# Patient Record
Sex: Male | Born: 1975 | Race: Black or African American | Hispanic: No | Marital: Married | Smoking: Former smoker
Health system: Southern US, Community
[De-identification: ages and names within clinical notes are randomized; demographics above are authoritative.]

## PROBLEM LIST (undated history)

## (undated) DIAGNOSIS — I10 Essential (primary) hypertension: Secondary | ICD-10-CM

---

## 2020-03-19 ENCOUNTER — Emergency Department (HOSPITAL_COMMUNITY): Payer: Self-pay

## 2020-03-19 ENCOUNTER — Other Ambulatory Visit: Payer: Self-pay

## 2020-03-19 ENCOUNTER — Emergency Department (HOSPITAL_COMMUNITY)
Admission: EM | Admit: 2020-03-19 | Discharge: 2020-03-19 | Disposition: A | Discharge status: Home / Self Care | Payer: Self-pay | Attending: Emergency Medicine | Admitting: Emergency Medicine

## 2020-03-19 ENCOUNTER — Encounter (HOSPITAL_COMMUNITY): Payer: Self-pay | Admitting: Emergency Medicine

## 2020-03-19 DIAGNOSIS — I10 Essential (primary) hypertension: Secondary | ICD-10-CM | POA: Insufficient documentation

## 2020-03-19 DIAGNOSIS — Z20822 Contact with and (suspected) exposure to covid-19: Secondary | ICD-10-CM | POA: Insufficient documentation

## 2020-03-19 DIAGNOSIS — R42 Dizziness and giddiness: Secondary | ICD-10-CM | POA: Insufficient documentation

## 2020-03-19 DIAGNOSIS — I509 Heart failure, unspecified: Secondary | ICD-10-CM

## 2020-03-19 DIAGNOSIS — I517 Cardiomegaly: Secondary | ICD-10-CM | POA: Insufficient documentation

## 2020-03-19 DIAGNOSIS — Z87891 Personal history of nicotine dependence: Secondary | ICD-10-CM | POA: Insufficient documentation

## 2020-03-19 HISTORY — DX: Essential (primary) hypertension: I10

## 2020-03-19 LAB — TROPONIN I (HIGH SENSITIVITY)
Troponin I (High Sensitivity): 30 ng/L — ABNORMAL HIGH (ref <18)
Troponin I (High Sensitivity): 29 ng/L — ABNORMAL HIGH (ref <18)

## 2020-03-19 LAB — BASIC METABOLIC PANEL
Anion gap: 8 (ref 5–15)
BUN: 14 mg/dL (ref 6–20)
CO2: 21 mmol/L — ABNORMAL LOW (ref 22–32)
Calcium: 9 mg/dL (ref 8.9–10.3)
Chloride: 109 mmol/L (ref 98–111)
Creatinine, Ser: 1.34 mg/dL — ABNORMAL HIGH (ref 0.61–1.24)
GFR, Estimated: >60 mL/min (ref >60)
Glucose, Bld: 114 mg/dL — ABNORMAL HIGH (ref 70–99)
Potassium: 3.8 mmol/L (ref 3.5–5.1)
Sodium: 138 mmol/L (ref 135–145)

## 2020-03-19 LAB — URINALYSIS, ROUTINE W REFLEX MICROSCOPIC
Bilirubin Urine: NEGATIVE
Glucose, UA: NEGATIVE mg/dL
Hgb urine dipstick: NEGATIVE
Ketones, ur: NEGATIVE mg/dL
Leukocytes,Ua: NEGATIVE
Nitrite: NEGATIVE
Protein, ur: NEGATIVE mg/dL
Specific Gravity, Urine: 1.014 (ref 1.005–1.030)
pH: 5 (ref 5.0–8.0)

## 2020-03-19 LAB — CBC
HCT: 40.6 % (ref 39.0–52.0)
Hemoglobin: 12.6 g/dL — ABNORMAL LOW (ref 13.0–17.0)
MCH: 27.9 pg (ref 26.0–34.0)
MCHC: 31 g/dL (ref 30.0–36.0)
MCV: 90 fL (ref 80.0–100.0)
Platelets: 379 10*3/uL (ref 150–400)
RBC: 4.51 MIL/uL (ref 4.22–5.81)
RDW: 13.6 % (ref 11.5–15.5)
WBC: 5.4 10*3/uL (ref 4.0–10.5)
nRBC: 0 % (ref 0.0–0.2)

## 2020-03-19 LAB — RESPIRATORY PANEL BY RT PCR (FLU A&B, COVID)
Influenza A by PCR: NEGATIVE
Influenza B by PCR: NEGATIVE
SARS Coronavirus 2 by RT PCR: NEGATIVE

## 2020-03-19 LAB — BRAIN NATRIURETIC PEPTIDE: B Natriuretic Peptide: 216.8 pg/mL — ABNORMAL HIGH (ref 0.0–100.0)

## 2020-03-19 LAB — CBG MONITORING, ED: Glucose-Capillary: 99 mg/dL (ref 70–99)

## 2020-03-19 MED ORDER — SPIRONOLACTONE 50 MG PO TABS
50.0000 mg | ORAL_TABLET | Freq: Every day | ORAL | 1 refills | Status: DC
Start: 2020-03-19 — End: 2020-12-10

## 2020-03-19 MED ORDER — IOHEXOL 350 MG/ML SOLN
80.0000 mL | Freq: Once | INTRAVENOUS | Status: AC | PRN
  Administered 2020-03-19: 80 mL via INTRAVENOUS

## 2020-03-19 MED ORDER — FUROSEMIDE 40 MG PO TABS: 40.0000 mg | ORAL_TABLET | Freq: Every day | ORAL | 0 refills | Status: DC

## 2020-03-19 MED ORDER — FUROSEMIDE 10 MG/ML IJ SOLN
40.0000 mg | Freq: Once | INTRAMUSCULAR | Status: AC
  Administered 2020-03-19: 40 mg via INTRAVENOUS
  Filled 2020-03-19: qty 4

## 2020-03-19 MED ORDER — LOSARTAN POTASSIUM 50 MG PO TABS
100.0000 mg | ORAL_TABLET | Freq: Every day | ORAL | Status: DC
  Administered 2020-03-19: 100 mg via ORAL
  Filled 2020-03-19: qty 2

## 2020-03-19 MED ORDER — CARVEDILOL 3.125 MG PO TABS
25.0000 mg | ORAL_TABLET | Freq: Two times a day (BID) | ORAL | Status: DC
  Administered 2020-03-19 (×2): 25 mg via ORAL
  Filled 2020-03-19 (×2): qty 8

## 2020-03-19 MED ORDER — ASPIRIN 81 MG PO CHEW
324.0000 mg | CHEWABLE_TABLET | Freq: Once | ORAL | Status: AC
  Administered 2020-03-19: 324 mg via ORAL
  Filled 2020-03-19: qty 4

## 2020-03-19 MED ORDER — SPIRONOLACTONE 25 MG PO TABS
50.0000 mg | ORAL_TABLET | Freq: Every day | ORAL | Status: DC
  Administered 2020-03-19: 50 mg via ORAL
  Filled 2020-03-19: qty 2

## 2020-03-19 MED ORDER — HYDRALAZINE HCL 25 MG PO TABS
100.0000 mg | ORAL_TABLET | Freq: Once | ORAL | Status: AC
  Administered 2020-03-19: 100 mg via ORAL
  Filled 2020-03-19: qty 4

## 2020-03-19 NOTE — Discharge Instructions (Addendum)
We will have you restart your losartan blood pressure medication.  I have also ordered Lasix.  Follow-up with Dr. Rosemary Holms in his office as we discussed.  Return as needed for worsening symptoms

## 2020-03-19 NOTE — ED Notes (Signed)
Pt denies any complaints at this time

## 2020-03-19 NOTE — ED Triage Notes (Signed)
Pt. Stated, I started having SOB and dizziness a couple of days ago.

## 2020-03-19 NOTE — ED Provider Notes (Signed)
[facility] EMERGENCY DEPARTMENT Provider Note   CSN: [id number] Arrival date & time: 03/19/20  2585     History Chief Complaint  Patient presents with  . Shortness of Breath  . Dizziness    William Mcgrath is a 44 y.o. male.  HPI   This patient is a 44 year old male reporting a history of hypertension and congestive heart failure, he has recently stopped smoking and denies drinking alcohol or using cocaine.  He states that recently has become more short of breath over the last several days and activities such as laying flat, trying to sleep at night or even walking up the stairs make him extremely dyspneic.  This culminated this morning during sexual intercourse with his wife where he felt like he could not finish because he was too short of breath.  The patient denies chest pain, fever, swelling of the legs but does state that he is having increasing shortness of breath and is having some coughing which is productive of some clear phlegm.  He is not vaccinated for Covid.  He reports that his symptoms have been persistent, worsening, have now become severe.  He was admitted to a hospital in [place] in [place] within the last year, told that he had congestive heart failure, has been placed on medications which she has been out of for over 3 weeks.  He denies being a diabetic and denies prior heart attack.  Past Medical History:  Diagnosis Date  . Hypertension     There are no problems to display for this patient.   History reviewed. No pertinent surgical history.     No family history on file.  Social History   Tobacco Use  . Smoking status: Former Games developer  . Smokeless tobacco: Never Used  Substance Use Topics  . Alcohol use: Not Currently  . Drug use: Not Currently    Home Medications Prior to Admission medications   Not on File    Allergies    Patient has no allergy information on record.  Review of Systems   Review of Systems  All  other systems reviewed and are negative.   Physical Exam Updated Vital Signs BP (!) 165/115 (BP Location: Right Arm)   Pulse 94   Temp 98.1 F (36.7 C) (Oral)   Resp 20   SpO2 97%   Physical Exam Vitals and nursing note reviewed.  Constitutional:      Appearance: He is well-developed.  HENT:     Head: Normocephalic and atraumatic.     Mouth/Throat:     Pharynx: No oropharyngeal exudate.  Eyes:     General: No scleral icterus.       Right eye: No discharge.        Left eye: No discharge.     Conjunctiva/sclera: Conjunctivae normal.     Pupils: Pupils are equal, round, and reactive to light.  Neck:     Thyroid: No thyromegaly.     Vascular: No JVD.  Cardiovascular:     Rate and Rhythm: Normal rate and regular rhythm.     Heart sounds: Normal heart sounds. No murmur heard.  No friction rub. No gallop.      Comments: No JVD present Pulmonary:     Effort: Tachypnea present.     Breath sounds: Rales present. No wheezing.     Comments: Subtle rales at the bases that clear with deep breathing, tachypneic, breathing approximately 25-28 times per minute Abdominal:     General: Bowel sounds  are normal. There is no distension.     Palpations: Abdomen is soft. There is no mass.     Tenderness: There is no abdominal tenderness.  Musculoskeletal:        General: No tenderness. Normal range of motion.     Cervical back: Normal range of motion and neck supple.     Right lower leg: No edema.     Left lower leg: No edema.  Lymphadenopathy:     Cervical: No cervical adenopathy.  Skin:    General: Skin is warm and dry.     Findings: No erythema or rash.  Neurological:     General: No focal deficit present.     Mental Status: He is alert.     Coordination: Coordination normal.  Psychiatric:        Behavior: Behavior normal.     ED Results / Procedures / Treatments   Labs (all labs ordered are listed, but only abnormal results are displayed) Labs Reviewed  BASIC METABOLIC  PANEL  CBC  URINALYSIS, ROUTINE W REFLEX MICROSCOPIC  CBG MONITORING, ED    EKG EKG Interpretation  Date/Time:  Sunday March 19 2020 07:25:36 EST Ventricular Rate:  87 PR Interval:  138 QRS Duration: 88 QT Interval:  394 QTC Calculation: 474 R Axis:   72 Text Interpretation: Normal sinus rhythm Left ventricular hypertrophy with repolarization abnormality ( Sokolow-Lyon ) Cannot rule out Inferior infarct , age undetermined Abnormal ECG No old tracing to compare Confirmed by Eber Hong ([id number]) on 03/19/2020 7:36:34 AM   Radiology No results found.  Procedures Procedures (including critical care time)  Medications Ordered in ED Medications - No data to display  ED Course  I have reviewed the triage vital signs and the nursing notes.  Pertinent labs & imaging results that were available during my care of the patient were reviewed by me and considered in my medical decision making (see chart for details).    MDM Rules/Calculators/A&P                          This patient is hypertensive blood pressure 165/115, his EKG is abnormal showing left ventricular hypertrophy but no signs of acute ischemia or arrhythmia.  His symptoms could be related to congestive heart failure though I would also consider other things such as COVID-19, pneumonia, COPD given his history of tobacco use however he is not wheezing at this time.  We will proceed with BNP, labs, cardiac monitoring and blood pressure control, the patient is agreeable to the plan.  Review of the medical record shows records from [place] at the cardiology office was seen in November 2020.  During that time it was noted that the patient had an ejection fraction that was closer to 55% though earlier in that same year he was down between 25 and 30%.  He had concentric hypertrophy on his echocardiogram and an ischemic work-up was not pursued as this was not thought to be ischemic in origin.  It was also noted that he was  to be on the following medications   Losartan 100 mg daily  Spironolactone 50 mg daily instead of Lasix  Hydralazine 100 mg 3 times a day  Carvedilol 25 mg twice daily  Medications have been ordered for the patient  This case was discussed with Dr. Lynelle Doctor the oncoming emergency department provider will follow up results and disposition accordingly, anticipate admission due to borderline elevated troponin with worsening shortness of  breath.  CT scan pending  Final Clinical Impression(s) / ED Diagnoses Final diagnoses:  None    Rx / DC Orders ED Discharge Orders    None       Eber Hong, MD 03/19/20 1525

## 2020-03-19 NOTE — ED Provider Notes (Signed)
Pt initially seen by Dr Hyacinth Meeker.  Please see his note.  BNP is elevated.  Trop is also increased.  Cannot exclude ACS but pt is not having chest pain.  More suspicious for CHF.  Will check delta.   CT angio without signs of PE.  More likely CHF exacerbation.  Will give a dose of lasix and consult with cardiology.  Pt is interested in outpt management if possible.  Reviewed the case with Dr. Rosemary Holms.  I will check a delta troponin here.  If no significant change we will have the patient follow-up with him.  Will dc home on lasix.  Make sure patient is taking his losartan and hydralazine.  Patient's last blood pressure is on the low end at 109/75.  I will have him restart his losartan and Lasix but hold on his other medications.  Patient did diurese 2 L of urine.  He is breathing better.  His delta troponin is unchanged.  Patient is stable for discharge with close cardiology follow up.  Clinical Course as of Mar 19 1930  Wynelle Link Mar 19, 2020  1906 Delta troponin unchanged at 29   [JK]    Clinical Course User Index [JK] Linwood Dibbles, MD      Linwood Dibbles, MD 03/19/20 [phone]

## 2020-03-19 NOTE — Care Management (Signed)
Assigned CHW for Primary Care MD. Needs to call and make appt ASAP to establish.

## 2020-04-11 ENCOUNTER — Ambulatory Visit: Payer: Self-pay | Admitting: Cardiology

## 2020-04-25 ENCOUNTER — Ambulatory Visit: Payer: Self-pay | Admitting: Cardiology

## 2020-11-07 ENCOUNTER — Emergency Department (HOSPITAL_COMMUNITY)
Admission: EM | Admit: 2020-11-07 | Discharge: 2020-11-08 | Disposition: A | Discharge status: Home / Self Care | Payer: Self-pay | Attending: Emergency Medicine | Admitting: Emergency Medicine

## 2020-11-07 ENCOUNTER — Other Ambulatory Visit: Payer: Self-pay

## 2020-11-07 DIAGNOSIS — H11002 Unspecified pterygium of left eye: Secondary | ICD-10-CM | POA: Insufficient documentation

## 2020-11-07 DIAGNOSIS — Z87891 Personal history of nicotine dependence: Secondary | ICD-10-CM | POA: Insufficient documentation

## 2020-11-07 DIAGNOSIS — Z79899 Other long term (current) drug therapy: Secondary | ICD-10-CM | POA: Insufficient documentation

## 2020-11-07 DIAGNOSIS — I10 Essential (primary) hypertension: Secondary | ICD-10-CM | POA: Insufficient documentation

## 2020-11-07 MED ORDER — TETRACAINE HCL 0.5 % OP SOLN
1.0000 [drp] | Freq: Once | OPHTHALMIC | Status: AC
  Administered 2020-11-08: 1 [drp] via OPHTHALMIC
  Filled 2020-11-07: qty 4

## 2020-11-07 MED ORDER — FLUORESCEIN SODIUM 1 MG OP STRP
1.0000 | ORAL_STRIP | Freq: Once | OPHTHALMIC | Status: AC
  Administered 2020-11-08: 1 via OPHTHALMIC
  Filled 2020-11-07: qty 1

## 2020-11-07 NOTE — ED Provider Notes (Signed)
Emergency Medicine Provider Triage Evaluation Note  William Mcgrath , a 45 y.o. male  was evaluated in triage.  Pt complains of left eye pain.  States it felt itchy earlier.  Now reports having pain and blurry vision.  Reports FB sensation.  Denies flashes or floaters.  Review of Systems  Positive: Eye pain, blurry vision Negative: Double vision, flashes, floaters  Physical Exam  BP (!) 171/119   Pulse 85   Temp 98.1 F (36.7 C) (Oral)   Resp (!) 22   Ht 6\' 1"  (1.854 m)   Wt 122.5 kg   SpO2 98%   BMI 35.62 kg/m  Gen:   Awake, no distress   Resp:  Normal effort  MSK:   Moves extremities without difficulty  Other:  Mild left conjunctival erythema, no visible FB  Medical Decision Making  Medically screening exam initiated at 11:15 PM.  Appropriate orders placed.  William Mcgrath was informed that the remainder of the evaluation will be completed by another provider, this initial triage assessment does not replace that evaluation, and the importance of remaining in the ED until their evaluation is complete.  Eye pain ?corneal abrasion   Kevin Fenton, PA-C 11/07/20 2317    01/08/21, MD 11/08/20 [phone]

## 2020-11-07 NOTE — ED Triage Notes (Signed)
Pt c/o left eye pain and blurry vision starting today. Denies chemical exposure. No eye discharge.

## 2020-11-08 MED ORDER — KETOROLAC TROMETHAMINE 0.5 % OP SOLN
1.0000 [drp] | Freq: Four times a day (QID) | OPHTHALMIC | Status: DC
  Administered 2020-11-08: 1 [drp] via OPHTHALMIC
  Filled 2020-11-08: qty 5

## 2020-11-08 NOTE — Discharge Instructions (Signed)
Can continue using eyedrops for discomfort. Can follow-up with Dr. Vonna Kotyk if still having issues-- call for appt. Return here for new concerns.

## 2020-11-08 NOTE — ED Provider Notes (Signed)
[facility] EMERGENCY DEPARTMENT Provider Note   CSN: [id number] Arrival date & time: 11/07/20  2106     History Chief Complaint  Patient presents with   Eye Pain    William Mcgrath is a 45 y.o. male.  The history is provided by the patient and medical records.  Eye Pain  45 y.o. M with hx of HTN presenting to the ED with left eye pain.  States initially it started out as itching/irritation and now having increased pain and some blurriness to vision.  He denies any chemical or foreign body exposure to the eye that he is aware of.  He does not wear glasses or corrective lenses.  He has not tried any eyedrops or ointment prior to arrival.  He reports seeing a "bump" in the white of his eye earlier today which makes him concerned that is something in his eye.  Past Medical History:  Diagnosis Date   Hypertension     There are no problems to display for this patient.   No past surgical history on file.     No family history on file.  Social History   Tobacco Use   Smoking status: Former    Pack years: 0.00   Smokeless tobacco: Never  Substance Use Topics   Alcohol use: Not Currently   Drug use: Not Currently    Home Medications Prior to Admission medications   Medication Sig Start Date End Date Taking? Authorizing Provider  atorvastatin (LIPITOR) 10 MG tablet Take 10 mg by mouth at bedtime.   Yes [provider]  carvedilol (COREG) 25 MG tablet Take 25 mg by mouth in the morning and at bedtime.   Yes [provider]  furosemide (LASIX) 40 MG tablet Take 1 tablet (40 mg total) by mouth daily. 03/19/20  Yes Linwood Dibbles, MD  hydrALAZINE (APRESOLINE) 100 MG tablet Take 100 mg by mouth in the morning, at noon, and at bedtime. 03/29/19  Yes [provider]  losartan (COZAAR) 100 MG tablet Take 100 mg by mouth daily. 08/18/20  Yes [provider]  spironolactone (ALDACTONE) 50 MG tablet Take 1 tablet (50 mg total) by mouth  daily. 03/19/20  Yes Linwood Dibbles, MD    Allergies    Patient has no known allergies.  Review of Systems   Review of Systems  Eyes:  Positive for pain.  All other systems reviewed and are negative.  Physical Exam Updated Vital Signs BP (!) 159/117 (BP Location: Left Arm)   Pulse 73   Temp 97.7 F (36.5 C) (Oral)   Resp 18   Ht 6\' 1"  (1.854 m)   Wt 122.5 kg   SpO2 99%   BMI 35.62 kg/m   Physical Exam Vitals and nursing note reviewed.  Constitutional:      Appearance: He is well-developed.  HENT:     Head: Normocephalic and atraumatic.  Eyes:     Conjunctiva/sclera: Conjunctivae normal.     Pupils: Pupils are equal, round, and reactive to light.      Comments: Conjunctiva of left eye is mildly injected but there is no hemorrhage, no visible foreign body, does have pterygium as documented above, no fluorescein uptake, no corneal abrasion or ulcer, no lid edema or erythema  Cardiovascular:     Rate and Rhythm: Normal rate and regular rhythm.     Heart sounds: Normal heart sounds.  Pulmonary:     Effort: Pulmonary effort is normal. No respiratory distress.  Breath sounds: Normal breath sounds. No rhonchi.  Abdominal:     General: Bowel sounds are normal.     Palpations: Abdomen is soft.  Musculoskeletal:        General: Normal range of motion.     Cervical back: Normal range of motion.  Skin:    General: Skin is warm and dry.  Neurological:     Mental Status: He is alert and oriented to person, place, and time.    ED Results / Procedures / Treatments   Labs (all labs ordered are listed, but only abnormal results are displayed) Labs Reviewed - No data to display  EKG None  Radiology No results found.  Procedures Procedures   Medications Ordered in ED Medications  ketorolac (ACULAR) 0.5 % ophthalmic solution 1 drop (has no administration in time range)  tetracaine (PONTOCAINE) 0.5 % ophthalmic solution 1 drop (1 drop Both Eyes Given 11/08/20 0523)   fluorescein ophthalmic strip 1 strip (1 strip Both Eyes Given 11/08/20 0523)    ED Course  I have reviewed the triage vital signs and the nursing notes.  Pertinent labs & imaging results that were available during my care of the patient were reviewed by me and considered in my medical decision making (see chart for details).    MDM Rules/Calculators/A&P  45 year old male presenting to the ED with left eye pain.  Initially began as itching/irritation, feels like there is a "bump" in his left eye which she saw earlier.  Denies any known foreign body exposure.  On exam he has no lid edema or erythema, does appear to have a pterygium to the left medial conjunctive a.  This does not seem to be obstructing his field of vision.  There is some mild conjunctival injection without hemorrhage or visible FB.  No fluorescein update, no corneal ulcer or abrasion.  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  May have also gotten some dust in his eye today but I suspect this may be what has caused his pterygium.  Will start on Acular drops for discomfort have him follow-up closely with eye doctor.  Return here for any new/acute changes.  Final Clinical Impression(s) / ED Diagnoses Final diagnoses:  Pterygium eye, left    Rx / DC Orders ED Discharge Orders     None        Garlon Hatchet, PA-C 11/08/20 [id number]    Dione Booze, MD 11/08/20 [phone]

## 2020-12-10 ENCOUNTER — Emergency Department (HOSPITAL_COMMUNITY): Payer: Self-pay

## 2020-12-10 ENCOUNTER — Encounter (HOSPITAL_COMMUNITY): Payer: Self-pay

## 2020-12-10 ENCOUNTER — Emergency Department (HOSPITAL_COMMUNITY)
Admission: EM | Admit: 2020-12-10 | Discharge: 2020-12-10 | Disposition: A | Discharge status: Home / Self Care | Payer: Self-pay | Attending: Emergency Medicine | Admitting: Emergency Medicine

## 2020-12-10 ENCOUNTER — Other Ambulatory Visit: Payer: Self-pay

## 2020-12-10 DIAGNOSIS — R202 Paresthesia of skin: Secondary | ICD-10-CM | POA: Insufficient documentation

## 2020-12-10 DIAGNOSIS — Z79899 Other long term (current) drug therapy: Secondary | ICD-10-CM | POA: Insufficient documentation

## 2020-12-10 DIAGNOSIS — I509 Heart failure, unspecified: Secondary | ICD-10-CM | POA: Insufficient documentation

## 2020-12-10 DIAGNOSIS — Z87891 Personal history of nicotine dependence: Secondary | ICD-10-CM | POA: Insufficient documentation

## 2020-12-10 DIAGNOSIS — R110 Nausea: Secondary | ICD-10-CM | POA: Insufficient documentation

## 2020-12-10 DIAGNOSIS — R079 Chest pain, unspecified: Secondary | ICD-10-CM | POA: Insufficient documentation

## 2020-12-10 DIAGNOSIS — Z20822 Contact with and (suspected) exposure to covid-19: Secondary | ICD-10-CM | POA: Insufficient documentation

## 2020-12-10 DIAGNOSIS — I10 Essential (primary) hypertension: Secondary | ICD-10-CM

## 2020-12-10 DIAGNOSIS — I110 Hypertensive heart disease with heart failure: Secondary | ICD-10-CM | POA: Insufficient documentation

## 2020-12-10 LAB — LIPASE, BLOOD: Lipase: 48 U/L (ref 11–51)

## 2020-12-10 LAB — RESP PANEL BY RT-PCR (FLU A&B, COVID) ARPGX2
Influenza A by PCR: NEGATIVE
Influenza B by PCR: NEGATIVE
SARS Coronavirus 2 by RT PCR: NEGATIVE

## 2020-12-10 LAB — CBC WITH DIFFERENTIAL/PLATELET
Abs Immature Granulocytes: 0.02 10*3/uL (ref 0.00–0.07)
Basophils Absolute: 0 10*3/uL (ref 0.0–0.1)
Basophils Relative: 0 %
Eosinophils Absolute: 0 10*3/uL (ref 0.0–0.5)
Eosinophils Relative: 0 %
HCT: 42.7 % (ref 39.0–52.0)
Hemoglobin: 13.6 g/dL (ref 13.0–17.0)
Immature Granulocytes: 0 %
Lymphocytes Relative: 16 %
Lymphs Abs: 1.6 10*3/uL (ref 0.7–4.0)
MCH: 28.3 pg (ref 26.0–34.0)
MCHC: 31.9 g/dL (ref 30.0–36.0)
MCV: 88.8 fL (ref 80.0–100.0)
Monocytes Absolute: 1.3 10*3/uL — ABNORMAL HIGH (ref 0.1–1.0)
Monocytes Relative: 13 %
Neutro Abs: 7 10*3/uL (ref 1.7–7.7)
Neutrophils Relative %: 71 %
Platelets: 347 10*3/uL (ref 150–400)
RBC: 4.81 MIL/uL (ref 4.22–5.81)
RDW: 13.3 % (ref 11.5–15.5)
WBC: 9.9 10*3/uL (ref 4.0–10.5)
nRBC: 0 % (ref 0.0–0.2)

## 2020-12-10 LAB — URINALYSIS, ROUTINE W REFLEX MICROSCOPIC
Bilirubin Urine: NEGATIVE
Glucose, UA: NEGATIVE mg/dL
Hgb urine dipstick: NEGATIVE
Ketones, ur: NEGATIVE mg/dL
Leukocytes,Ua: NEGATIVE
Nitrite: NEGATIVE
Protein, ur: NEGATIVE mg/dL
Specific Gravity, Urine: 1.02 (ref 1.005–1.030)
pH: 6 (ref 5.0–8.0)

## 2020-12-10 LAB — COMPREHENSIVE METABOLIC PANEL
ALT: 21 U/L (ref 0–44)
AST: 19 U/L (ref 15–41)
Albumin: 4.5 g/dL (ref 3.5–5.0)
Alkaline Phosphatase: 64 U/L (ref 38–126)
Anion gap: 7 (ref 5–15)
BUN: 13 mg/dL (ref 6–20)
CO2: 26 mmol/L (ref 22–32)
Calcium: 9.3 mg/dL (ref 8.9–10.3)
Chloride: 104 mmol/L (ref 98–111)
Creatinine, Ser: 1.31 mg/dL — ABNORMAL HIGH (ref 0.61–1.24)
GFR, Estimated: >60 mL/min (ref >60)
Glucose, Bld: 104 mg/dL — ABNORMAL HIGH (ref 70–99)
Potassium: 3.5 mmol/L (ref 3.5–5.1)
Sodium: 137 mmol/L (ref 135–145)
Total Bilirubin: 0.8 mg/dL (ref 0.3–1.2)
Total Protein: 8.1 g/dL (ref 6.5–8.1)

## 2020-12-10 LAB — TROPONIN I (HIGH SENSITIVITY)
Troponin I (High Sensitivity): 36 ng/L — ABNORMAL HIGH (ref <18)
Troponin I (High Sensitivity): 39 ng/L — ABNORMAL HIGH (ref <18)

## 2020-12-10 LAB — CBG MONITORING, ED: Glucose-Capillary: 114 mg/dL — ABNORMAL HIGH (ref 70–99)

## 2020-12-10 MED ORDER — HYDRALAZINE HCL 100 MG PO TABS: 100.0000 mg | ORAL_TABLET | Freq: Three times a day (TID) | ORAL | 0 refills | Status: AC

## 2020-12-10 MED ORDER — LOSARTAN POTASSIUM 100 MG PO TABS: 100.0000 mg | ORAL_TABLET | Freq: Every day | ORAL | 0 refills | Status: AC

## 2020-12-10 MED ORDER — SPIRONOLACTONE 50 MG PO TABS: 50.0000 mg | ORAL_TABLET | Freq: Every day | ORAL | 0 refills | Status: DC

## 2020-12-10 MED ORDER — CARVEDILOL 25 MG PO TABS: 25.0000 mg | ORAL_TABLET | Freq: Two times a day (BID) | ORAL | 0 refills | Status: AC

## 2020-12-10 MED ORDER — SPIRONOLACTONE 50 MG PO TABS: 50.0000 mg | ORAL_TABLET | Freq: Every day | ORAL | 0 refills | Status: AC

## 2020-12-10 NOTE — ED Provider Notes (Signed)
[facility]-EMERGENCY DEPT Provider Note   CSN: [id number] Arrival date & time: 12/10/20  1402     History Chief Complaint  Patient presents with   Chest Pain    SOB, N/V, abd pain     William Mcgrath is a 45 y.o. male.  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  Patient states that he took the trash out at around 11 AM.  Around this time he developed a headache.  Shortly after this he developed nausea without vomiting, sharp chest pain described as safety pins in the mid chest, waxing and waning.  He was also feeling short of breath.  He states that he laid down and his bilateral feet went numb.  He states that he was helped up and his stepdaughter thought that both of his "eyes looked weak" on both sides.  He symptoms, including chest pain which was concerning to the patient, brought him to the hospital.  Per triage note patient has had intermittent diarrhea.  He states that he has been out of his medications for at least a week.  Chest pain does not radiate.  He denies diaphoresis or vomiting. The onset of this condition was acute. The course is constant. Aggravating factors: none. Alleviating factors: none.        Past Medical History:  Diagnosis Date   Hypertension     There are no problems to display for this patient.   History reviewed. No pertinent surgical history.     History reviewed. No pertinent family history.  Social History   Tobacco Use   Smoking status: Former   Smokeless tobacco: Never  Substance Use Topics   Alcohol use: Not Currently   Drug use: Not Currently    Home Medications Prior to Admission medications   Medication Sig Start Date End Date Taking? Authorizing Provider  atorvastatin (LIPITOR) 10 MG tablet Take 10 mg by mouth at bedtime.    [provider]  carvedilol (COREG) 25 MG tablet Take 25 mg by mouth in the morning and at bedtime.    [provider]  furosemide (LASIX) 40 MG tablet Take 1 tablet (40 mg total) by mouth daily. 03/19/20   Linwood Dibbles, MD  hydrALAZINE (APRESOLINE) 100 MG tablet Take 100 mg by mouth in the morning, at noon, and at bedtime. 03/29/19   [provider]  losartan (COZAAR) 100 MG tablet Take 100 mg by mouth daily. 08/18/20   [provider]  spironolactone (ALDACTONE) 50 MG tablet Take 1 tablet (50 mg total) by mouth daily. 03/19/20   Linwood Dibbles, MD    Allergies    Patient has no known allergies.  Review of Systems   Review of Systems  Constitutional:  Negative for diaphoresis and fever.  HENT:  Positive for congestion.   Eyes:  Negative for redness.  Respiratory:  Positive for shortness of breath. Negative for cough.   Cardiovascular:  Positive for chest pain. Negative for palpitations and leg swelling.  Gastrointestinal:  Positive for nausea. Negative for abdominal pain and vomiting.  Genitourinary:  Negative for dysuria.  Musculoskeletal:  Negative for back pain and neck pain.  Skin:  Negative for rash.  Neurological:  Positive for numbness (paresthesia) and headaches. Negative for syncope, facial asymmetry, speech difficulty, weakness and light-headedness.  Psychiatric/Behavioral:  The patient is not nervous/anxious.  Physical Exam Updated Vital Signs BP (!) 168/124 (BP Location: Left Arm)   Pulse 88   Temp 98 F (36.7 C) (Oral)   Resp (!) 22   Ht 6\' 1"  (1.854 m)   Wt 130.6 kg   SpO2 100%   BMI 38.00 kg/m   Physical Exam Vitals and nursing note reviewed.  Constitutional:      Appearance: He is well-developed. He is not diaphoretic.  HENT:     Head: Normocephalic and atraumatic.     Right Ear: Tympanic membrane, ear canal and external ear normal.     Left Ear: Tympanic membrane, ear canal and external ear normal.     Nose: Nose normal.      Mouth/Throat:     Mouth: Mucous membranes are not dry.     Pharynx: Uvula midline.  Eyes:     General: Lids are normal.     Conjunctiva/sclera: Conjunctivae normal.     Pupils: Pupils are equal, round, and reactive to light.  Neck:     Vascular: Normal carotid pulses. No carotid bruit or JVD.     Trachea: Trachea normal. No tracheal deviation.  Cardiovascular:     Rate and Rhythm: Normal rate and regular rhythm.     Pulses: No decreased pulses.          Radial pulses are 2+ on the right side and 2+ on the left side.     Heart sounds: Normal heart sounds, S1 normal and S2 normal. Heart sounds not distant. No murmur heard. Pulmonary:     Effort: Pulmonary effort is normal. No respiratory distress.     Breath sounds: Normal breath sounds. No wheezing.  Chest:     Chest wall: No tenderness.  Abdominal:     General: Bowel sounds are normal.     Palpations: Abdomen is soft.     Tenderness: There is no abdominal tenderness. There is no guarding or rebound.  Musculoskeletal:        General: Normal range of motion.     Cervical back: Normal range of motion and neck supple. No tenderness or bony tenderness. No muscular tenderness.     Right lower leg: No edema.     Left lower leg: No edema.  Skin:    General: Skin is warm and dry.     Coloration: Skin is not pale.  Neurological:     Mental Status: He is alert and oriented to person, place, and time. Mental status is at baseline.     GCS: GCS eye subscore is 4. GCS verbal subscore is 5. GCS motor subscore is 6.     Cranial Nerves: No cranial nerve deficit.     Sensory: No sensory deficit.     Motor: No abnormal muscle tone.     Coordination: Coordination normal.     Gait: Gait normal.     Deep Tendon Reflexes: Reflexes are normal and symmetric.  Psychiatric:        Mood and Affect: Mood normal.    ED Results / Procedures / Treatments   Labs (all labs ordered are listed, but only abnormal results are displayed) Labs Reviewed   COMPREHENSIVE METABOLIC PANEL - Abnormal; Notable for the following components:      Result Value   Glucose, Bld 104 (*)    Creatinine, Ser 1.31 (*)    All other components within normal limits  CBC WITH DIFFERENTIAL/PLATELET - Abnormal; Notable for the following components:   Monocytes Absolute 1.3 (*)  All other components within normal limits  CBG MONITORING, ED - Abnormal; Notable for the following components:   Glucose-Capillary 114 (*)    All other components within normal limits  TROPONIN I (HIGH SENSITIVITY) - Abnormal; Notable for the following components:   Troponin I (High Sensitivity) 36 (*)    All other components within normal limits  TROPONIN I (HIGH SENSITIVITY) - Abnormal; Notable for the following components:   Troponin I (High Sensitivity) 39 (*)    All other components within normal limits  RESP PANEL BY RT-PCR (FLU A&B, COVID) ARPGX2  LIPASE, BLOOD  URINALYSIS, ROUTINE W REFLEX MICROSCOPIC    ED ECG REPORT   Date: 12/10/2020  Rate: 89  Rhythm: normal sinus rhythm  QRS Axis: normal  Intervals: normal  ST/T Wave abnormalities: nonspecific T wave changes  Conduction Disutrbances:none  Narrative Interpretation:   Old EKG Reviewed: unchanged  I have personally reviewed the EKG tracing and agree with the computerized printout as noted.   Radiology DG Chest Portable 1 View  Result Date: 12/10/2020 CLINICAL DATA:  Chest pain.  Shortness of breath. EXAM: PORTABLE CHEST 1 VIEW COMPARISON:  March 19, 2020 FINDINGS: The heart size and mediastinal contours are within normal limits. Both lungs are clear. The visualized skeletal structures are unremarkable. IMPRESSION: No active disease. Electronically Signed   By: Gerome Samavid  Williams III M.D.   On: 12/10/2020 15:34    Procedures Procedures   Medications Ordered in ED Medications - No data to display  ED Course  I have reviewed the triage vital signs and the nursing notes.  Pertinent labs & imaging results  that were available during my care of the patient were reviewed by me and considered in my medical decision making (see chart for details).  Patient seen and examined. Work-up initiated.  Awaiting EKG.  Reviewed previous EKGs with abnormalities consistent with LVH.  Patient is not in any distress, he appears a bit anxious.  He is worried that he had a stroke.  Reassured that patient is not have any lateralizing symptoms.  He will require cardiac work-up.  Chest x-ray reviewed and is clear.  Blood pressure is elevated; patient does report being out of medication.  Vital signs reviewed and are as follows: BP (!) 176/124   Pulse 81   Temp 98 F (36.7 C) (Oral)   Resp 17   Ht 6\' 1"  (1.854 m)   Wt 130.6 kg   SpO2 96%   BMI 38.00 kg/m   3:55 PM EKG reviewed, similar to previous.   6:29 PM Work-up. Trop 36 >> 39. Stable mild elevated creatinine.  Discussed all results with patient at bedside.  He discussed importance of PCP and cardiology follow-up as well as consistent use of home medications given his history of cardiomyopathy.  He verbalizes understanding.  Will ensure that patient does well with walking.  His symptoms have resolved and he is comfortable with discharge.  Discussed low concern for stroke, ACS, pneumonia, blood clots, dissection.  Previously on:  Losartan 100 mg daily Spironolactone 50 mg daily instead of Lasix Hydralazine 100 mg 3 times a day Carvedilol 25 mg twice daily  6:52 PM patient has ambulated well, plan for discharge home.  I have written prescriptions for his chronic medications.  Patient confirmed that this is what he is on, including spironolactone.  Patient was counseled to return with severe chest pain, especially if the pain is crushing or pressure-like and spreads to the arms, back, neck, or jaw, or if  they have sweating, nausea, or shortness of breath with the pain. They were encouraged to call 911 with these symptoms.   Patient counseled to return if they  have weakness in their arms or legs, slurred speech, trouble walking or talking, confusion, trouble with their balance, or if they have any other concerns. Patient verbalizes understanding and agrees with plan.      MDM Rules/Calculators/A&P                           Patient with here with multiple complaints as above.  In setting of medication noncompliance recently.  Unfortunately, he does not have follow-up care established yet.  Evaluated for chest pain including troponin x2.  Troponins are slightly elevated, consistent with history, flat.  Chest x-ray without edema.  Patient has ambulated well without desaturation.  No focal neurologic symptoms to suggest stroke.  He is improved during ED stay with normal vital signs except for blood pressure which has been elevated.  Patient will be restarted on his home blood pressure medications.  Strongly encouraged follow-up as above.  Return instructions as above.  Final Clinical Impression(s) / ED Diagnoses Final diagnoses:  Chest pain, unspecified type  Paresthesia  Primary hypertension    Rx / DC Orders ED Discharge Orders     None        Renne Crigler, Cordelia Poche 12/10/20 1854    Cheryll Cockayne, MD 12/11/20 1555

## 2020-12-10 NOTE — ED Notes (Signed)
While ambulating, O2 remained greater than 96%.

## 2020-12-10 NOTE — ED Notes (Signed)
PA-C at the bedside.  ?

## 2020-12-10 NOTE — ED Triage Notes (Signed)
Pt presents to the ED via POV for chest pain, abd pain, SOB with exertion, nausea, headache, and nasal congestion. Pt states his sx began around 11:00AM this morning. He additionally c/o tingling and weakness in bilateral lower extremities.

## 2020-12-10 NOTE — ED Provider Notes (Signed)
Emergency Medicine Provider Triage Evaluation Note  William Mcgrath , a 45 y.o. male  was evaluated in triage.  Pt complains of chest pain.  States his symptoms started around 11 AM.  They are waxing and waning.  Describes it as sharp and central.  Reports associated abdominal pain, shortness of breath with exertion, nausea, headache, nasal congestion, as well as cough.  Also complains of some intermittent diarrhea.  Lastly, patient notes paresthesias in his feet.  States he is vaccinated for COVID-19 x1.  No known history of previous COVID-19 infections.  Physical Exam  BP (!) 168/124 (BP Location: Left Arm)   Pulse 88   Temp 98 F (36.7 C) (Oral)   Resp (!) 22   Ht 6\' 1"  (1.854 m)   Wt 130.6 kg   SpO2 100%   BMI 38.00 kg/m  Gen:   Awake, no distress   Resp:  Normal effort  MSK:   Moves extremities without difficulty  Other:    Medical Decision Making  Medically screening exam initiated at 3:08 PM.  Appropriate orders placed.  Niraj Ollinger was informed that the remainder of the evaluation will be completed by another provider, this initial triage assessment does not replace that evaluation, and the importance of remaining in the ED until their evaluation is complete.   Kevin Fenton, PA-C 12/10/20 1509    12/12/20, DO 12/11/20 1109

## 2020-12-10 NOTE — ED Notes (Signed)
Provider denies code stroke at this time.

## 2020-12-10 NOTE — ED Notes (Signed)
Provider at the bedside to evaluate. 

## 2020-12-10 NOTE — Discharge Instructions (Signed)
Please read and follow all provided instructions.  Your diagnoses today include:  1. Chest pain, unspecified type   2. Paresthesia     Tests performed today include: An EKG of your heart A chest x-ray Cardiac enzymes - a blood test for heart muscle damage Blood counts and electrolytes - slightly weak kidneys, unchanged from previous Vital signs. See below for your results today.   Medications prescribed:  Please restart your home medications.  Take any prescribed medications only as directed.  Follow-up instructions: Please follow-up with your primary care provider as soon as you can for further evaluation of your symptoms.  I also provided information for cardiology, which is important given your history of heart failure.  Return instructions:  SEEK IMMEDIATE MEDICAL ATTENTION IF: You have severe chest pain, especially if the pain is crushing or pressure-like and spreads to the arms, back, neck, or jaw, or if you have sweating, nausea (feeling sick to your stomach), or shortness of breath. THIS IS AN EMERGENCY. Don't wait to see if the pain will go away. Get medical help at once. Call 911 or [phone] (operator). DO NOT drive yourself to the hospital.  Your chest pain gets worse and does not go away with rest.  You have an attack of chest pain lasting longer than usual, despite rest and treatment with the medications your caregiver has prescribed.  You wake from sleep with chest pain or shortness of breath. You feel dizzy or faint. You have chest pain not typical of your usual pain for which you originally saw your caregiver.  You have any other emergent concerns regarding your health.  Additional Information: Chest pain comes from many different causes. Your caregiver has diagnosed you as having chest pain that is not specific for one problem, but does not require admission.  You are at low risk for an acute heart condition or other serious illness.   Your vital signs today were: BP (!)  162/95   Pulse 81   Temp 98 F (36.7 C) (Oral)   Resp 11   Ht 6\' 1"  (1.854 m)   Wt 130.6 kg   SpO2 96%   BMI 38.00 kg/m  If your blood pressure (BP) was elevated above 135/85 this visit, please have this repeated by your doctor within one month. --------------

## 2020-12-21 DIAGNOSIS — I10 Essential (primary) hypertension: Secondary | ICD-10-CM | POA: Insufficient documentation

## 2020-12-27 NOTE — Progress Notes (Deleted)
Cardiology Office Note:    Date:  12/27/2020   ID:  William Mcgrath, DOB 1975/10/22, MRN [id number]  PCP:  Patient, No Pcp Per (Inactive)  Cardiologist:  Norman Herrlich, MD   Referring MD: Renne Crigler, PA-C  ASSESSMENT:    No diagnosis found. PLAN:    In order of problems listed above:  ***  Next appointment   Medication Adjustments/Labs and Tests Ordered: Current medicines are reviewed at length with the patient today.  Concerns regarding medicines are outlined above.  No orders of the defined types were placed in this encounter.  No orders of the defined types were placed in this encounter.    No chief complaint on file. ***  History of Present Illness:    William Mcgrath is a 45 y.o. male who is being seen today for the evaluation of chest pain at the request of Renne Crigler, [place].  He was seen med [facility] ED 12/10/2020 for chest pain.  He has a history of cardiomyopathy and heart failure previously followed in [place].  His EKG shows sinus rhythm left atrial enlargement left ventricular hypertrophy consider ischemic T waves his high-sensitivity troponins were elevated without a significant delta 36 and 39.  Chest x-ray showed no active disease CMP creatinine 1.31 GFR greater than 60 cc sodium 137 potassium 3.5 CBC normal with a hemoglobin of 13.6 COVID influenza A and influenza B were all negative.  He was felt to be safe for discharge and outpatient follow-up and there was concern of medication noncompliance.  I reviewed the cardiology note from 03/29/2019 detailing a history of hypertension hyperlipidemia cardiomyopathy with improvement in ejection fraction to 55% in October 2020.  Initial EF 25 to 30% with concentric hypertrophy moderate left atrial enlargement and mild mitral regurgitation.  At that time his medications consisted of furosemide 40 mg daily carvedilol 25 mg twice daily spironolactone 25 mg daily losartan 100 mg daily hydralazine 100 mg twice  daily and a atorvastatin. Past Medical History:  Diagnosis Date   Hypertension     No past surgical history on file.  Current Medications: No outpatient medications have been marked as taking for the 12/28/20 encounter (Appointment) with Baldo Daub, MD.     Allergies:   Patient has no known allergies.   Social History   Socioeconomic History   Marital status: Married    Spouse name: Not on file   Number of children: Not on file   Years of education: Not on file   Highest education level: Not on file  Occupational History   Not on file  Tobacco Use   Smoking status: Former   Smokeless tobacco: Never  Substance and Sexual Activity   Alcohol use: Not Currently   Drug use: Not Currently   Sexual activity: Not on file  Other Topics Concern   Not on file  Social History Narrative   Not on file   Social Determinants of Health   Financial Resource Strain: Not on file  Food Insecurity: Not on file  Transportation Needs: Not on file  Physical Activity: Not on file  Stress: Not on file  Social Connections: Not on file     Family History: The patient's ***family history is not on file.  ROS:   ROS Please see the history of present illness.    *** All other systems reviewed and are negative.  EKGs/Labs/Other Studies Reviewed:    The following studies were reviewed today: ***  EKG:  EKG is *** ordered  today.  The ekg ordered today is personally reviewed and demonstrates ***  Recent Labs: 03/19/2020: B Natriuretic Peptide 216.8 12/10/2020: ALT 21; BUN 13; Creatinine, Ser 1.31; Hemoglobin 13.6; Platelets 347; Potassium 3.5; Sodium 137  Recent Lipid Panel No results found for: CHOL, TRIG, HDL, CHOLHDL, VLDL, LDLCALC, LDLDIRECT  Physical Exam:    VS:  There were no vitals taken for this visit.    Wt Readings from Last 3 Encounters:  12/10/20 288 lb (130.6 kg)  11/07/20 270 lb (122.5 kg)  03/19/20 270 lb (122.5 kg)     GEN: *** Well nourished, well developed  in no acute distress HEENT: Normal NECK: No JVD; No carotid bruits LYMPHATICS: No lymphadenopathy CARDIAC: ***RRR, no murmurs, rubs, gallops RESPIRATORY:  Clear to auscultation without rales, wheezing or rhonchi  ABDOMEN: Soft, non-tender, non-distended MUSCULOSKELETAL:  No edema; No deformity  SKIN: Warm and dry NEUROLOGIC:  Alert and oriented x 3 PSYCHIATRIC:  Normal affect     Signed, Norman Herrlich, MD  12/27/2020 1:48 PM    [facility]

## 2020-12-28 ENCOUNTER — Ambulatory Visit: Payer: Self-pay | Admitting: Cardiology

## 2021-01-08 ENCOUNTER — Ambulatory Visit: Payer: Self-pay | Admitting: Cardiology

## 2021-12-19 IMAGING — DX DG CHEST 1V PORT
1 series · 2 of 2 positions shown · non-contrast
Comparison: None.

CLINICAL DATA: Cough and shortness of breath 2-3 days.

EXAM:
PORTABLE CHEST 1 VIEW

[Series 1: chest ap · 0.14mm/px · 2 of 2 slices shown]
[im 1/2]
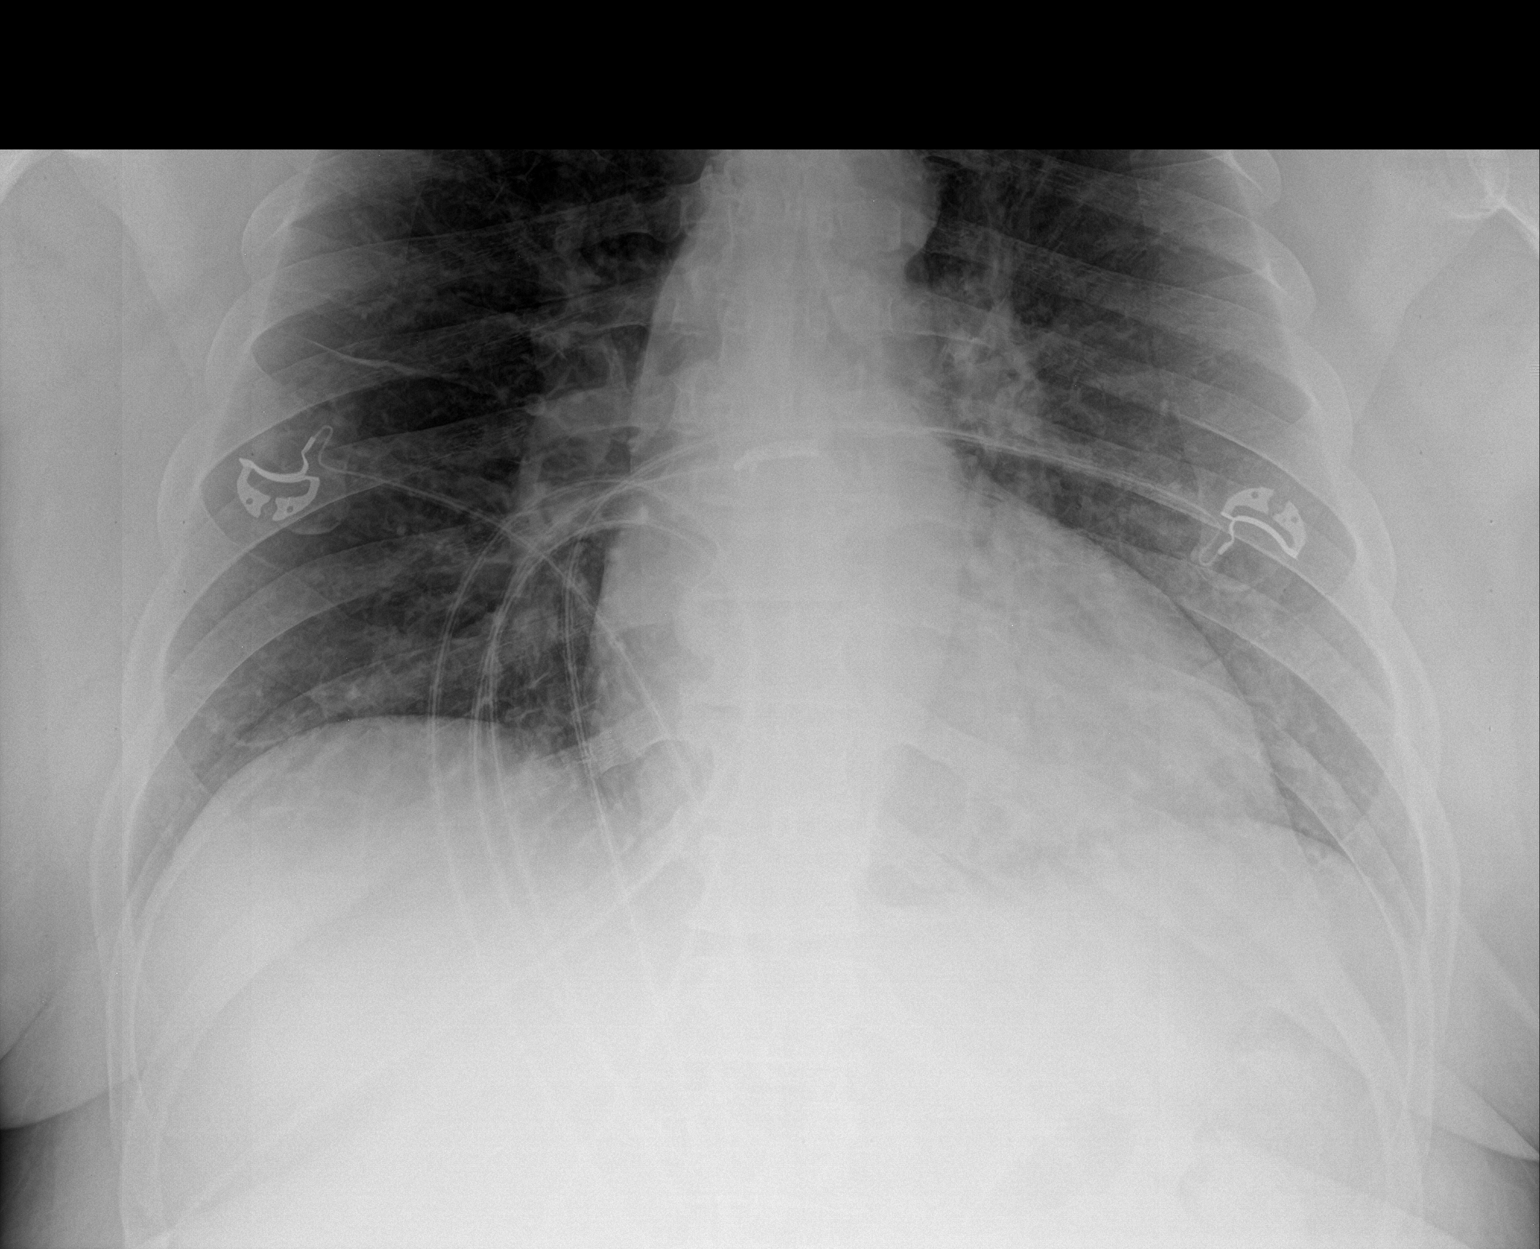
[im 2/2]
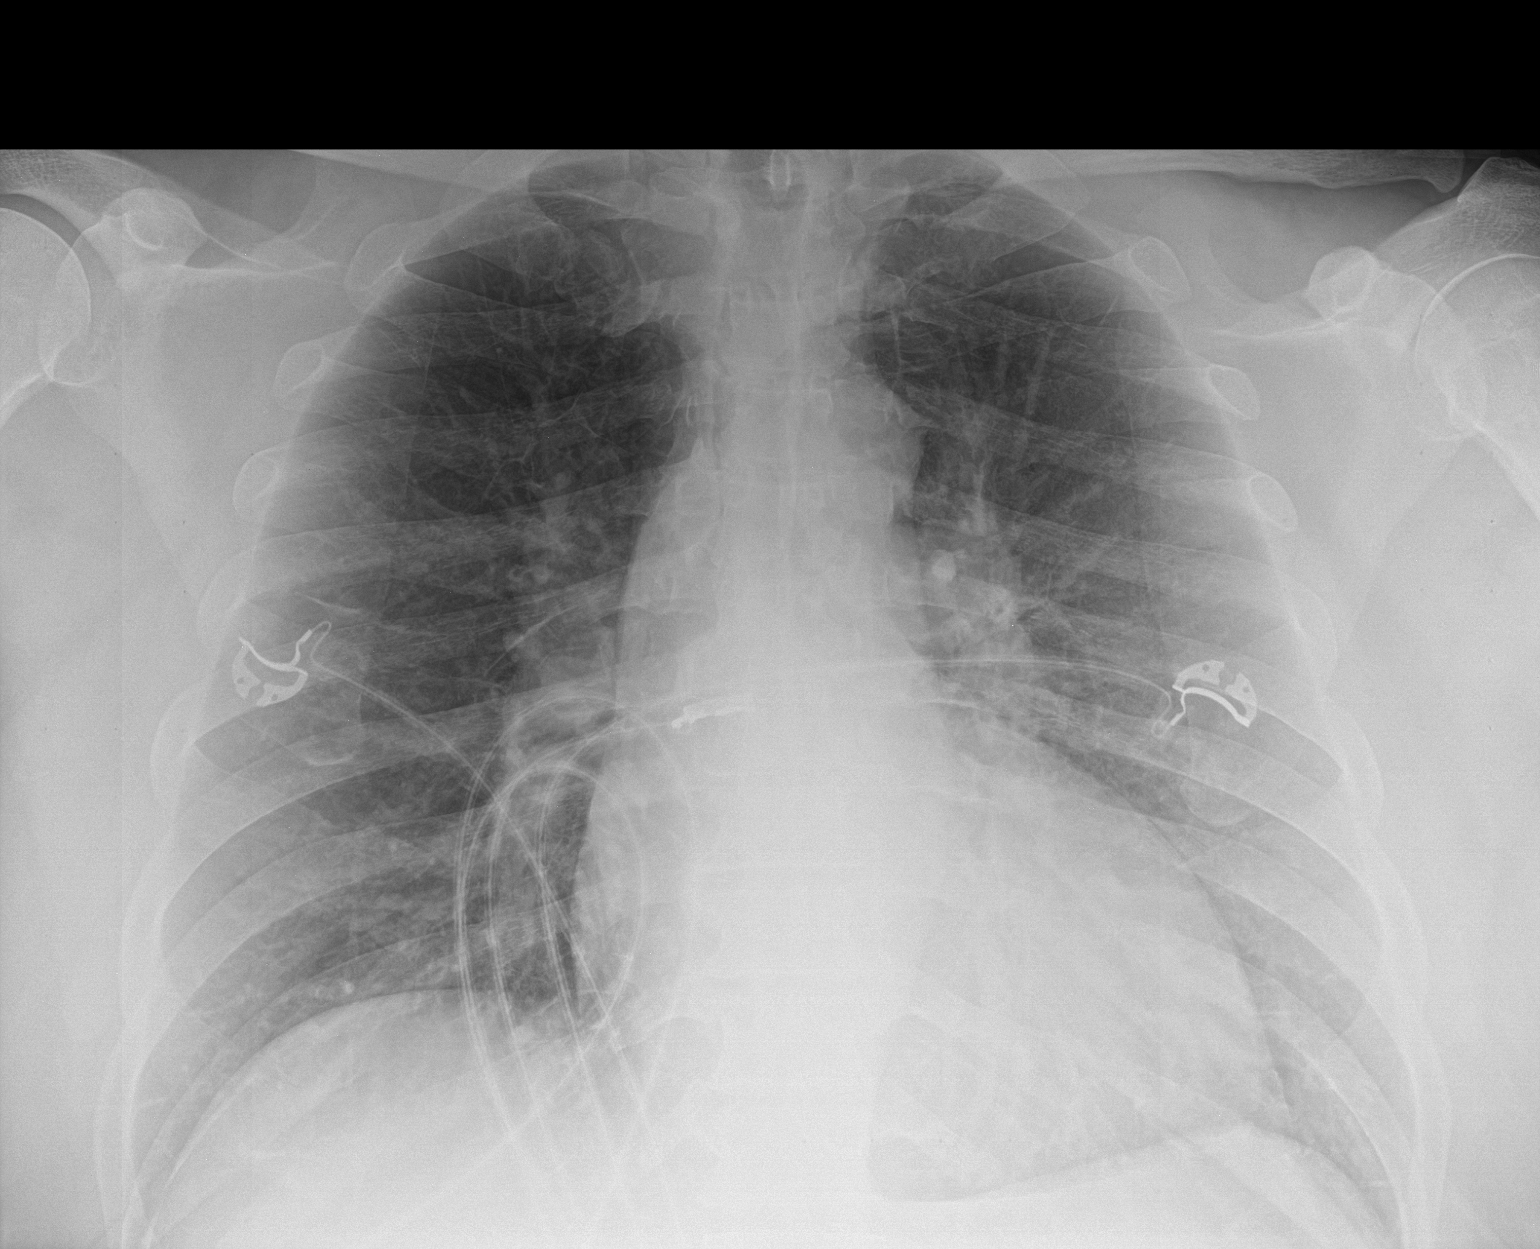

[2 of 2 positions shown; findings below may reference images not displayed]

FINDINGS: Lungs are adequately inflated without focal airspace consolidation
or effusion. Cardiomediastinal silhouette is within normal. Mild
degenerative change of the spine.
IMPRESSION: No active disease.
